# Patient Record
Sex: Male | Born: 1950 | Race: White | Hispanic: No | Marital: Single | Smoking: Current every day smoker
Health system: Southern US, Community
[De-identification: ages and names within clinical notes are randomized; demographics above are authoritative.]

## PROBLEM LIST (undated history)

## (undated) DIAGNOSIS — B029 Zoster without complications: Secondary | ICD-10-CM

## (undated) DIAGNOSIS — H579 Unspecified disorder of eye and adnexa: Secondary | ICD-10-CM

## (undated) DIAGNOSIS — G510 Bell's palsy: Principal | ICD-10-CM

## (undated) HISTORY — PX: APPENDECTOMY: SHX54

## (undated) HISTORY — PX: NO PAST SURGERIES: SHX2092

---

## 1978-10-18 DIAGNOSIS — G510 Bell's palsy: Principal | ICD-10-CM

## 1978-10-18 HISTORY — DX: Bell's palsy: G51.0

## 2007-02-22 ENCOUNTER — Emergency Department (HOSPITAL_COMMUNITY): Admission: EM | Admit: 2007-02-22 | Discharge: 2007-02-22 | Payer: Self-pay | Admitting: Emergency Medicine

## 2011-10-25 ENCOUNTER — Encounter: Payer: Self-pay | Admitting: Emergency Medicine

## 2011-10-25 ENCOUNTER — Inpatient Hospital Stay (HOSPITAL_COMMUNITY)
Admission: EM | Admit: 2011-10-25 | Discharge: 2011-10-27 | DRG: 074 | Disposition: A | Discharge status: Home / Self Care | Payer: 59 | Attending: Internal Medicine | Admitting: Internal Medicine

## 2011-10-25 ENCOUNTER — Emergency Department (HOSPITAL_COMMUNITY): Payer: 59

## 2011-10-25 DIAGNOSIS — G510 Bell's palsy: Principal | ICD-10-CM | POA: Diagnosis present

## 2011-10-25 DIAGNOSIS — H538 Other visual disturbances: Secondary | ICD-10-CM | POA: Diagnosis present

## 2011-10-25 DIAGNOSIS — F40298 Other specified phobia: Secondary | ICD-10-CM | POA: Diagnosis present

## 2011-10-25 DIAGNOSIS — R29810 Facial weakness: Secondary | ICD-10-CM | POA: Diagnosis present

## 2011-10-25 DIAGNOSIS — F17200 Nicotine dependence, unspecified, uncomplicated: Secondary | ICD-10-CM | POA: Diagnosis present

## 2011-10-25 DIAGNOSIS — H539 Unspecified visual disturbance: Secondary | ICD-10-CM

## 2011-10-25 DIAGNOSIS — R51 Headache: Secondary | ICD-10-CM | POA: Diagnosis present

## 2011-10-25 DIAGNOSIS — I10 Essential (primary) hypertension: Secondary | ICD-10-CM | POA: Diagnosis present

## 2011-10-25 HISTORY — DX: Unspecified disorder of eye and adnexa: H57.9

## 2011-10-25 HISTORY — DX: Zoster without complications: B02.9

## 2011-10-25 HISTORY — DX: Bell's palsy: G51.0

## 2011-10-25 LAB — CBC
HCT: 41.6 % (ref 39.0–52.0)
Hemoglobin: 14.8 g/dL (ref 13.0–17.0)
MCH: 33.5 pg (ref 26.0–34.0)
MCV: 94.1 fL (ref 78.0–100.0)
RBC: 4.42 MIL/uL (ref 4.22–5.81)
WBC: 10.5 10*3/uL (ref 4.0–10.5)

## 2011-10-25 LAB — RAPID URINE DRUG SCREEN, HOSP PERFORMED
Barbiturates: NOT DETECTED
Cocaine: NOT DETECTED
Tetrahydrocannabinol: NOT DETECTED

## 2011-10-25 LAB — LIPID PANEL
Cholesterol: 197 mg/dL (ref 0–200)
HDL: 103 mg/dL (ref >39)
Total CHOL/HDL Ratio: 1.9 RATIO

## 2011-10-25 LAB — BASIC METABOLIC PANEL
CO2: 29 mEq/L (ref 19–32)
Calcium: 10.1 mg/dL (ref 8.4–10.5)
Chloride: 100 mEq/L (ref 96–112)
Creatinine, Ser: 0.71 mg/dL (ref 0.50–1.35)
Glucose, Bld: 93 mg/dL (ref 70–99)

## 2011-10-25 MED ORDER — SODIUM CHLORIDE 0.9 % IJ SOLN
3.0000 mL | Freq: Two times a day (BID) | INTRAMUSCULAR | Status: DC
  Administered 2011-10-25 – 2011-10-27 (×4): 3 mL via INTRAVENOUS

## 2011-10-25 MED ORDER — ACYCLOVIR 200 MG PO CAPS: 400.0000 mg | ORAL_CAPSULE | Freq: Once | ORAL | Status: DC

## 2011-10-25 MED ORDER — ENOXAPARIN SODIUM 40 MG/0.4ML ~~LOC~~ SOLN
40.0000 mg | SUBCUTANEOUS | Status: DC
  Administered 2011-10-25 – 2011-10-26 (×2): 40 mg via SUBCUTANEOUS
  Filled 2011-10-25 (×3): qty 0.4

## 2011-10-25 MED ORDER — ACETAMINOPHEN 325 MG PO TABS: 650.0000 mg | ORAL_TABLET | Freq: Four times a day (QID) | ORAL | Status: DC | PRN

## 2011-10-25 MED ORDER — ONDANSETRON HCL 4 MG/2ML IJ SOLN: 4.0000 mg | Freq: Four times a day (QID) | INTRAMUSCULAR | Status: DC | PRN

## 2011-10-25 MED ORDER — SODIUM CHLORIDE 0.9 % IV SOLN: 250.0000 mL | INTRAVENOUS | Status: DC | PRN

## 2011-10-25 MED ORDER — LABETALOL HCL 5 MG/ML IV SOLN: 5.0000 mg | INTRAVENOUS | Status: DC | PRN

## 2011-10-25 MED ORDER — LABETALOL HCL 5 MG/ML IV SOLN: 10.0000 mg | INTRAVENOUS | Status: DC | PRN

## 2011-10-25 MED ORDER — PREDNISONE 20 MG PO TABS
60.0000 mg | ORAL_TABLET | Freq: Once | ORAL | Status: AC
  Administered 2011-10-25: 60 mg via ORAL
  Filled 2011-10-25: qty 3

## 2011-10-25 MED ORDER — ACETAMINOPHEN 650 MG RE SUPP: 650.0000 mg | Freq: Four times a day (QID) | RECTAL | Status: DC | PRN

## 2011-10-25 MED ORDER — LABETALOL HCL 5 MG/ML IV SOLN
10.0000 mg | Freq: Once | INTRAVENOUS | Status: AC
  Administered 2011-10-25: 10 mg via INTRAVENOUS
  Filled 2011-10-25: qty 4

## 2011-10-25 MED ORDER — SODIUM CHLORIDE 0.9 % IJ SOLN: 3.0000 mL | INTRAMUSCULAR | Status: DC | PRN

## 2011-10-25 MED ORDER — VALACYCLOVIR HCL 500 MG PO TABS
1000.0000 mg | ORAL_TABLET | Freq: Once | ORAL | Status: AC
  Administered 2011-10-25: 1000 mg via ORAL
  Filled 2011-10-25: qty 2

## 2011-10-25 MED ORDER — ONDANSETRON HCL 4 MG PO TABS: 4.0000 mg | ORAL_TABLET | Freq: Four times a day (QID) | ORAL | Status: DC | PRN

## 2011-10-25 NOTE — ED Notes (Signed)
Noticed blurred vision both eyes since 0830  And left side of face numbness and throbbing in back of head

## 2011-10-25 NOTE — H&P (Signed)
Brent Sharp is an 61 y.o. male. 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  These symptoms began today about 8 am and shortly after patient developed a 2/10 dull aching headache on the L posterior occiput.  Blurry vision is intermittant and associated with new onset diplopia as well. Patient also endorses numbeness of top L lip.  Otherwise patient denies weakness, sensory deficit, vertigo, lightheadedness, imbalance, falls, syncope, change in speech or hearing, nausea, vomitting, diarrhea, shortness of breath, palpitations, chest pain.   Review of Systems  HENT: Negative for hearing loss, ear pain and tinnitus.   Eyes: Positive for blurred vision (new onset L eye) and double vision. Negative for photophobia, pain and discharge.  Cardiovascular: Negative for chest pain and palpitations.  Gastrointestinal: Negative for nausea, vomiting, abdominal pain, diarrhea and constipation.  Musculoskeletal: Negative for falls.  Skin: Negative for itching and rash.  Neurological: Positive for focal weakness (left face, left eye, cannot blink left eye), weakness (left face) and headaches (left, posterior, 2/10). Negative for dizziness, sensory change and speech change.  Psychiatric/Behavioral: The patient is nervous/anxious (regarding MRI, extreme claustrophobia).    Past Medical History  Diagnosis Date  . Bell's palsy 1980    L sided, resolved completely in about 1 week  . Shingles     at age 43 and 57, both outbreaks on back, treatment with "shots" per patient  . Eye disease     "macular scarring" per patient, peripheral vision only in R eye   Past Surgical History  Procedure Date  . Appendectomy     61 year old   History   Social History  . Marital Status: Single    Spouse Name: N/A    Number  of Children: 0  . Years of Education: N/A   Occupational History  . accountant    Social History Main Topics  . Smoking status: Current Everyday Smoker -- 0.5 packs/day for 47 years    Types: Cigarettes  . Smokeless tobacco: Not on file  . Alcohol Use: Yes     1 shot and 2 beers every other day  . Drug Use: No  . Sexually Active: Not on file   Other Topics Concern  . Not on file   Social History Narrative   Lives alone, single in [place].Does not drive 2/2 several DUIs in past.No pets.Works as an Airline pilot.    Blood pressure 224/122, pulse 112, temperature 97.8 F (36.6 C), temperature source Oral, resp. rate 16, SpO2 96.00%. Physical Exam  Constitutional: He is oriented to person, place, and time. He appears well-developed and well-nourished. No distress.  HENT:  Head: Normocephalic and atraumatic.  Right Ear: External ear normal.  Left Ear: External ear normal.  Eyes: Conjunctivae and EOM are normal. Pupils are equal, round, and reactive to light. Right eye exhibits no discharge. Left eye exhibits no discharge. No scleral icterus. Right eye exhibits normal extraocular motion and no nystagmus. Left eye exhibits normal extraocular motion and no nystagmus.  Neck: Normal range of motion. Neck supple. No JVD present. No tracheal deviation present.  Cardiovascular: S1 normal, S2 normal and intact distal pulses.  Exam reveals no gallop and no friction rub.   No murmur heard.      Several irregularly spaced dropped beats on ausculation, irregular  rate  Respiratory: Effort normal and breath sounds normal. No stridor. Not tachypneic. No respiratory distress. He has no wheezes. He has no rhonchi. He has no rales. He exhibits no tenderness.  Musculoskeletal: He exhibits no edema and no tenderness.  Neurological: He is alert and oriented to person, place, and time. He has normal strength and normal reflexes. He displays no tremor and normal reflexes. A cranial nerve deficit (CN  XII-deviation of tongue to the R on protrusion,  CN VII-weakness of L sided upper and lower face) is present. No sensory deficit. He exhibits normal muscle tone. He displays no seizure activity. Coordination (cerebellar testing wnl) normal. He displays no Babinski's sign on the right side. He displays no Babinski's sign on the left side.  Reflex Scores:      Bicep reflexes are 2+ on the right side and 2+ on the left side.      Patellar reflexes are 2+ on the right side and 2+ on the left side. Skin: Skin is warm and dry. No rash noted. He is not diaphoretic. No erythema. No pallor.  Psychiatric: He has a normal mood and affect. His behavior is normal. Judgment and thought content normal.    Labs: Admission on 10/25/2011  Component Date Value Range Status  . WBC (K/uL) 10/25/2011 10.5  4.0-10.5 Final  . RBC (MIL/uL) 10/25/2011 4.42  4.22-5.81 Final  . Hemoglobin (g/dL) 30/86/5784 69.6  29.5-28.4 Final  . HCT (%) 10/25/2011 41.6  39.0-52.0 Final  . MCV (fL) 10/25/2011 94.1  78.0-100.0 Final  . MCH (pg) 10/25/2011 33.5  26.0-34.0 Final  . MCHC (g/dL) 13/24/4010 27.2  53.6-64.4 Final  . RDW (%) 10/25/2011 14.6  11.5-15.5 Final  . Platelets (K/uL) 10/25/2011 262  150-400 Final  . Sodium (mEq/L) 10/25/2011 141  135-145 Final  . Potassium (mEq/L) 10/25/2011 4.5  3.5-5.1 Final  . Chloride (mEq/L) 10/25/2011 100  96-112 Final  . CO2 (mEq/L) 10/25/2011 29  19-32 Final  . Glucose, Bld (mg/dL) 03/47/4259 93  56-38 Final  . BUN (mg/dL) 75/64/3329 8  5-18 Final  . Creatinine, Ser (mg/dL) 84/16/6063 0.16  0.10-9.32 Final  . Calcium (mg/dL) 35/57/3220 25.4  2.7-06.2 Final  . GFR calc non Af Amer (mL/min) 10/25/2011 >90  >90 Final  . GFR calc Af Amer (mL/min) 10/25/2011 >90  >90 Final   UDS:  Lipid:  Hepatitis:  HIV:  Imaging: CT Head w/o Contrast Findings: There is no acute intracranial hemorrhage, infarction, or  mass. Brain parenchyma is normal. Osseous structures are normal.  Orbits  appear normal.  IMPRESSION:  Normal exam.  Assessment/Plan 1) Left facial weakness with blurry vision: Differential diagnoses includes Bell's palsy versus CVA versus hypertensive urgency. CT scan negative for CVA. MRI not done as patient claustrophobic. Labs unremarkable.  Plan: Admit patient to telemetry bed. - Control blood pressure with goal of more than 170/100. - MRI brain if any change in neuro status. - .Prednisone and Valtrex for Bell's palsy - Check hepatitis C panel  2) Hypertensive urgency: No prior history of hypertension. Electrolytes within normal limits.  Plan: Start labetalol PRN with BP goal of no less than 170/100. - Check Lipid panel, UDS.  3) DVT prophylaxis: Lovenox.  Dwaine Gale 10/25/2011, 6:56 PM

## 2011-10-25 NOTE — ED Notes (Signed)
Admitting at bedside 

## 2011-10-25 NOTE — ED Notes (Signed)
[facility] paged

## 2011-10-25 NOTE — ED Notes (Signed)
CT

## 2011-10-25 NOTE — ED Notes (Signed)
Pt unable to tolerate MRI, PA notified

## 2011-10-25 NOTE — ED Notes (Signed)
Pt reports around 830 this am he had sudden onset of left facial tingling mainly to lips, blurred vision to left eye (pt with macular scaring to right eye with only peripheral vision), and left sided facial droop. On exam pts eyebrows are not symmetrical, pt with left sided facial droop, pt is alert and oriented x 3. Pt reports he has had intermittent headache to the back of his head rates it 1/10. Pt with no numbness to the left side of face at this time. States lips are tingly.

## 2011-10-25 NOTE — ED Provider Notes (Signed)
History     CSN: [id number]  Arrival date & time 10/25/11  1304   First MD Initiated Contact with Patient 10/25/11 1530      Chief Complaint  Patient presents with  . Blurred Vision    (Consider location/radiation/quality/duration/timing/severity/associated sxs/prior treatment) HPI History provided by pt.   Pt reports that left eye started to twitch on his way to work at 8:30am today.  When he turned on his computer at work, he noticed diplopia (words on top of each other).  When he closed his right eye, the diplopia resolved but the words on the screen were still blurred.  Vision changes associated w/ left facial droop, inability to close left eyelid and numbness of left upper lip.  Developed a mild left posterior headache this afternoon.  Denies confusion, dysarthria, dysphagia, extremity weakness/paresthesias, ataxia.  Symptoms have not improved at all since onset.  Pt has h/o right macular scarring and therefore has no central vision on this side.  He is otherwise healthy.  FH stroke at later age.    Past Medical History  Diagnosis Date  . Bell's palsy     No past surgical history on file.  No family history on file.  History  Substance Use Topics  . Smoking status: Not on file  . Smokeless tobacco: Not on file  . Alcohol Use:       Review of Systems  All other systems reviewed and are negative.    Allergies  Review of patient's allergies indicates no known allergies.  Home Medications  No current outpatient prescriptions on file.  BP 226/141  Pulse 112  Temp(Src) 97.8 F (36.6 C) (Oral)  Resp 16  SpO2 96%  Physical Exam  Nursing note and vitals reviewed. Constitutional: He is oriented to person, place, and time. He appears well-developed and well-nourished. No distress.  HENT:  Head: Normocephalic and atraumatic.  Eyes:       Normal appearance  Neck: Normal range of motion.  Cardiovascular: Normal rate, regular rhythm and intact distal pulses.    Hypertensive at 209/130  Pulmonary/Chest: Effort normal and breath sounds normal.  Musculoskeletal: Normal range of motion.  Neurological: He is alert and oriented to person, place, and time. He has normal reflexes. He displays no tremor. He displays a negative Romberg sign. Coordination and gait normal.       Left facial drooping w/ inability to raise left upper lip  or close left eyelid.  Can not lift left eyebrow as high as the right. Decreased sensation left upper lip but otherwise no sensory deficits.  5/5 and equal upper and lower extremity strength.  No past pointing.  No pronator drift.    Skin: Skin is warm and dry. No rash noted.  Psychiatric: He has a normal mood and affect. His behavior is normal.    ED Course  Procedures (including critical care time)   Labs Reviewed  CBC  BASIC METABOLIC PANEL  URINE RAPID DRUG SCREEN (HOSP PERFORMED)  LIPID PANEL  RPR  COMPREHENSIVE METABOLIC PANEL  CBC  HEPATITIS PANEL, ACUTE  HIV ANTIBODY (ROUTINE TESTING)   Ct Head Wo Contrast  10/25/2011  *RADIOLOGY REPORT*  Clinical Data: Blurred vision. Left facial drooping.  Left facial paresthesias.  CT HEAD WITHOUT CONTRAST  Technique:  Contiguous axial images were obtained from the base of the skull through the vertex without contrast.  Comparison: 02/22/2007  Findings: There is no acute intracranial hemorrhage, infarction, or mass.  Brain parenchyma is normal.  Osseous structures  are normal. Orbits appear normal.  IMPRESSION: Normal exam.  Original Report Authenticated By: Gwynn Burly, M.D.     1. Vision changes   2. Hypertension       MDM  Previously healthy 61yo M presents w/ vision changes, left facial droop and left upper lip numbness since 8am.  Obvious left facial droop on exam but more consistent w/ bells palsy (able to lift left eyebrow but still asymmetrical).  No other neuro deficits.  BP 226/124 in triage but has improved to 180/103 (w/out improvement in vision).  Visual  acuity 20/40 L, 20/70 R and 20/70 both.  CT head neg and labs unremarkable.  MRI brain pending.  Pt has received prednisone and valacyclovir for bells palsy.  Teaching service consulted for admission for stroke vs. Hypertensive emergency.  Because ischemic stroke has not been ruled out and is improving w/out intervention, did not treat w/ anti-hypertensives.  5:47 PM   Pt went to MRI and then refused exam d/t anxiety.  Will have admitting team order if they feel it is still necessary.  5:48 PM         Otilio Miu, PA 10/26/11 [phone]

## 2011-10-26 ENCOUNTER — Other Ambulatory Visit: Payer: Self-pay

## 2011-10-26 DIAGNOSIS — R0989 Other specified symptoms and signs involving the circulatory and respiratory systems: Secondary | ICD-10-CM

## 2011-10-26 DIAGNOSIS — I10 Essential (primary) hypertension: Secondary | ICD-10-CM | POA: Diagnosis present

## 2011-10-26 LAB — RPR: RPR Ser Ql: NONREACTIVE

## 2011-10-26 LAB — CBC
HCT: 38.9 % — ABNORMAL LOW (ref 39.0–52.0)
MCH: 32.3 pg (ref 26.0–34.0)
MCV: 93.1 fL (ref 78.0–100.0)
RDW: 14.7 % (ref 11.5–15.5)
WBC: 7 10*3/uL (ref 4.0–10.5)

## 2011-10-26 LAB — COMPREHENSIVE METABOLIC PANEL
ALT: 11 U/L (ref 0–53)
AST: 13 U/L (ref 0–37)
Albumin: 4.1 g/dL (ref 3.5–5.2)
Alkaline Phosphatase: 64 U/L (ref 39–117)
CO2: 26 mEq/L (ref 19–32)
Chloride: 98 mEq/L (ref 96–112)
Creatinine, Ser: 0.68 mg/dL (ref 0.50–1.35)
GFR calc non Af Amer: >90 mL/min (ref >90)
Potassium: 3.9 mEq/L (ref 3.5–5.1)
Sodium: 135 mEq/L (ref 135–145)
Total Bilirubin: 0.6 mg/dL (ref 0.3–1.2)

## 2011-10-26 LAB — HEPATITIS PANEL, ACUTE
Hep A IgM: NEGATIVE
Hep B C IgM: NEGATIVE

## 2011-10-26 MED ORDER — PREDNISONE 10 MG PO TABS
60.0000 mg | ORAL_TABLET | Freq: Every day | ORAL | Status: DC
  Administered 2011-10-26 – 2011-10-27 (×2): 60 mg via ORAL
  Filled 2011-10-26 (×3): qty 1

## 2011-10-26 MED ORDER — ASPIRIN EC 81 MG PO TBEC
81.0000 mg | DELAYED_RELEASE_TABLET | Freq: Every day | ORAL | Status: DC
  Administered 2011-10-26 – 2011-10-27 (×2): 81 mg via ORAL
  Filled 2011-10-26 (×2): qty 1

## 2011-10-26 MED ORDER — VALACYCLOVIR HCL 500 MG PO TABS
1000.0000 mg | ORAL_TABLET | Freq: Three times a day (TID) | ORAL | Status: DC
  Administered 2011-10-26 – 2011-10-27 (×2): 1000 mg via ORAL
  Filled 2011-10-26 (×4): qty 2

## 2011-10-26 NOTE — H&P (Signed)
59 man admitted for acute neurologic sx: blurring vision and/or diplopia, numbness of left upper lip, and left facial droop.  He became very frightened because his mother has had strokes.  He has a hx of shingles at age 61 and age 89, and has had Bell's Palsy 35 years ago.  Now he has a left facial droop with partial involvement of forehead and incomplete closure of left eye.  Pupils react and EOMI full; He reports fatigue diplopia while reading but none at the moment.  Extremity strength full. On admission, BP was very high: 224/122 but is normal today after only 1 dose of labetalol 15 hours ago.  Ectopy sensed on exam yesterday but not confirmed by monitoring overnight.  UDS negative.  HIV negative CBC and Bmet normal. Head CT w/o contrast normal.  Refuses MR due to claustrophobia. This is most likely Bell's Palsy but stroke (especially posterior circulation) is possible.  Will try to arrange open MR and will ask Neuro to consult.  Treat for Bell's.  He should have follow-up to track BP but it seems now that he does not have HTN.  Counseled him to attempt formal smoking cessation.  Lipids are already optimal.

## 2011-10-26 NOTE — Consult Note (Signed)
[facility] STROKE CONSULT NOTE       Chief Complaint: Left facial droop   HPI:    Brent Sharp is an 61 y.o. male Who noted on Monday that his vision in th eleft eye seemed blurry.  HE states the blurred vision would come and go.  At the same time he also noted his left eye would tear up a lot.  When he would clear the tears from his eye he noted the vision improved temporarily.  Due to left facial droop, inability to close left eye and vision abnormalities he presented to the ED.  His CT head showed no acute stroke however his BP was 226/124.Marland Kitchen  He has had 7th cranial nerve pasley when he was 61 YO on the left side. At that time he states his facial droop was much more pronounced and he had dysarthria.  Symptoms at that time resolved in about 3 weeks.      LSN: Monday 10-25-11 tPA Given: No: out of window    Past Medical History  Diagnosis Date  . Bell's palsy 1980    L sided, resolved completely in about 1 week  . Shingles     at age 41 and 65, both outbreaks on back, treatment with "shots" per patient  . Eye disease     "macular scarring" per patient, peripheral vision only in R eye    Past Surgical History  Procedure Date  . Appendectomy     61 year old  . No past surgeries     Family History  Problem Relation Age of Onset  . Stroke Mother 57  . Aortic aneurysm Father 52    aortic valve aneurysm s/p transplant  . Diabetes Maternal Grandmother    Social History:  reports that he has been smoking Cigarettes.  He has a 23.5 pack-year smoking history. He does not have any smokeless tobacco history on file. He reports that he drinks alcohol. He reports that he does not use illicit drugs.  Allergies: No Known Allergies  Medications:    Prior to Admission:  No prescriptions prior to admission   Scheduled:   . enoxaparin  40 mg Subcutaneous Q24H  . labetalol  10 mg Intravenous Once  . predniSONE  60 mg Oral Once  . sodium chloride  3 mL  Intravenous Q12H  . valACYclovir  1,000 mg Oral Once  . DISCONTD: acyclovir  400 mg Oral Once    ROS: History obtained from the patient  General ROS: negative for - chills, fatigue, fever, night sweats, weight gain or weight loss Psychological ROS: negative for - behavioral disorder, hallucinations, memory difficulties, mood swings or suicidal ideation Ophthalmic ROS: negative for - blurry vision, double vision, eye pain or loss of vision ENT ROS: negative for - epistaxis, nasal discharge, oral lesions, sore throat, tinnitus or vertigo Allergy and Immunology ROS: negative for - hives or itchy/watery eyes Hematological and Lymphatic ROS: negative for - bleeding problems, bruising or swollen lymph nodes Endocrine ROS: negative for - galactorrhea, hair pattern changes, polydipsia/polyuria or temperature intolerance Respiratory ROS: negative for - cough, hemoptysis, shortness of breath or wheezing Cardiovascular ROS: negative for - chest pain, dyspnea on exertion, edema or irregular heartbeat Gastrointestinal ROS: negative for - abdominal pain, diarrhea, hematemesis, nausea/vomiting or stool incontinence Genito-Urinary ROS: negative for - dysuria, hematuria, incontinence or urinary frequency/urgency Musculoskeletal ROS: negative for - joint swelling or muscular weakness Neurological ROS: as  noted in HPI Dermatological ROS: negative for rash and skin lesion changes   Physical Examination: Blood pressure 121/75, pulse 102, temperature 98.1 F (36.7 C), temperature source Oral, resp. rate 18, height 5\' 11"  (1.803 m), weight 62.9 kg (138 lb 10.7 oz), SpO2 92.00%.  Neurologic Examination:  Mental Status: Alert, oriented, thought content appropriate.  Speech fluent without evidence of aphasia. Able to follow 3 step commands without difficulty. Cranial Nerves: II-Visual fields grossly intact. He has baseline decreased central vision loss in right eye III/IV/VI-Extraocular movements intact.   Pupils reactive bilaterally. V/VII-Smile asymmetric with decreased ability to move left forehead but still can make furrows.  Inability to close right eyelid, facial droop in left NLF and corner of mouth. Facial sensation is intact bilaterally. VIII-grossly intact IX/X-normal gag XI-bilateral shoulder shrug XII-midline tongue extension Motor: 5/5 bilaterally with normal tone and bulk Sensory: Pinprick and light touch intact throughout, bilaterally Deep Tendon Reflexes: 2+ and symmetric throughout Plantars: Downgoing bilaterally Cerebellar: Normal finger-to-nose, normal rapid alternating movements and normal heel-to-shin test.      Lab Results  Component Value Date/Time   CHOL 197 10/25/2011  8:29 PM   Ct Head Wo Contrast  10/25/2011  *RADIOLOGY REPORT*  Clinical Data: Blurred vision. Left facial drooping.  Left facial paresthesias.  CT HEAD WITHOUT CONTRAST  Technique:  Contiguous axial images were obtained from the base of the skull through the vertex without contrast.  Comparison: 02/22/2007  Findings: There is no acute intracranial hemorrhage, infarction, or mass.  Brain parenchyma is normal.  Osseous structures are normal. Orbits appear normal.  IMPRESSION: Normal exam.  Original Report Authenticated By: Gwynn Burly, M.D.    Carotid doppler --negative for ICA stenosis EKG--NSR LDL--83  Assessment:    60 y.o. male who presented to the ED due to left facial droop and blurred vision in the setting of BP 226/124.  His hypertension has resolved however he continues to have left facial droop, inability to close left eye, and decreased ability to furrow his left forehead. Exam consistent with a Bells Palsy. Complaints of vision not consistent and patient not the best historian.  Can not rule out the possibility of a small ischemic event, especially in the clinical setting of markedly elevated BP.  Patient will not consent to even an open MRI after discharge.  Stroke Risk Factors -  hypertension  Plan:  1. Prophylactic therapy-Antiplatelet med: Aspirin - dose 81 mg daily 2. Follow up out patient neurology after D/C 3. Lyme titer 4. TSH      Patient seen and examined. I agree with the above.  Thana Farr, MD [facility] [phone]  10/26/2011  5:54 PM

## 2011-10-26 NOTE — Progress Notes (Signed)
Subjective: Patient feeling well this morning.  Says vision is about 50% better, could read the paper well for first 10 minutes or so before developing blurry vision.  Also reports on questioning that he did have some photophobia with the onset of symptoms yesterday but never pain with EOM.  Pain was not a significant a component as it is with headaches that he gets occasionally during stressful times at work.  Facial weakness unchanged and no new symptoms reported. Objective: Vital signs in last 24 hours: Filed Vitals:   10/25/11 1905 10/25/11 2027 10/26/11 0124 10/26/11 0633  BP: 181/107 177/97 149/82 143/84  Pulse:  95 87 84  Temp:  98.3 F (36.8 C) 98.3 F (36.8 C) 98.4 F (36.9 C)  TempSrc:  Oral Oral Oral  Resp: 16 20 22 20   Height:  5\' 11"  (1.803 m)    Weight:  62.9 kg (138 lb 10.7 oz)    SpO2:  96% 94% 96%   Weight change:   Intake/Output Summary (Last 24 hours) at 10/26/11 0936 Last data filed at 10/26/11 0839  Gross per 24 hour  Intake    240 ml  Output      0 ml  Net    240 ml   Physical Exam: General appearance: alert, cooperative, appears stated age and no distress Lungs: clear to auscultation bilaterally Heart: regular rate and rhythm, S1, S2 normal, no murmur, click, rub or gallop and normal apical impulse Abdomen: soft, non-tender; bowel sounds normal; no masses,  no organomegaly Neurologic: Mental status: Alert, oriented, thought content appropriate Cranial nerves: VII: upper facial muscle function normal bilaterally, VII: lower facial muscle function abnormal on the left. Patients mouth is dropping and he is unable to completely close eye. Very little involuntary forehead motion on the left. Lab Results: Admission on 10/25/2011  Component Date Value Range Status  . WBC (K/uL) 10/25/2011 10.5  4.0-10.5 Final  . RBC (MIL/uL) 10/25/2011 4.42  4.22-5.81 Final  . Hemoglobin (g/dL) 16/07/9603 54.0  98.1-19.1 Final  . HCT (%) 10/25/2011 41.6  39.0-52.0 Final  . MCV  (fL) 10/25/2011 94.1  78.0-100.0 Final  . MCH (pg) 10/25/2011 33.5  26.0-34.0 Final  . MCHC (g/dL) 47/82/9562 13.0  86.5-78.4 Final  . RDW (%) 10/25/2011 14.6  11.5-15.5 Final  . Platelets (K/uL) 10/25/2011 262  150-400 Final  . Sodium (mEq/L) 10/25/2011 141  135-145 Final  . Potassium (mEq/L) 10/25/2011 4.5  3.5-5.1 Final  . Chloride (mEq/L) 10/25/2011 100  96-112 Final  . CO2 (mEq/L) 10/25/2011 29  19-32 Final  . Glucose, Bld (mg/dL) 69/62/9528 93  41-32 Final  . BUN (mg/dL) 44/10/270 8  5-36 Final  . Creatinine, Ser (mg/dL) 64/40/3474 2.59  5.63-8.75 Final  . Calcium (mg/dL) 64/33/2951 88.4  1.6-60.6 Final  . GFR calc non Af Amer (mL/min) 10/25/2011 >90  >90 Final  . GFR calc Af Amer (mL/min) 10/25/2011 >90  >90 Final  . Sodium (mEq/L) 10/26/2011 135  135-145 Final  . Potassium (mEq/L) 10/26/2011 3.9  3.5-5.1 Final  . Chloride (mEq/L) 10/26/2011 98  96-112 Final  . CO2 (mEq/L) 10/26/2011 26  19-32 Final  . Glucose, Bld (mg/dL) 30/16/0109 323* 55-73 Final  . BUN (mg/dL) 22/11/5425 9  0-62 Final  . Creatinine, Ser (mg/dL) 37/62/8315 1.76  1.60-7.37 Final  . Calcium (mg/dL) 10/62/6948 9.7  5.4-62.7 Final  . Total Protein (g/dL) 03/50/0938 7.0  1.8-2.9 Final  . Albumin (g/dL) 93/71/6967 4.1  8.9-3.8 Final  . AST (U/L) 10/26/2011 13  0-37 Final  .  ALT (U/L) 10/26/2011 11  0-53 Final  . Alkaline Phosphatase (U/L) 10/26/2011 64  39-117 Final  . Total Bilirubin (mg/dL) 40/98/1191 0.6  4.7-8.2 Final  . GFR calc non Af Amer (mL/min) 10/26/2011 >90  >90 Final  . GFR calc Af Amer (mL/min) 10/26/2011 >90  >90 Final  . Opiates  10/25/2011 NONE DETECTED  NONE DETECTED Final  . Cocaine  10/25/2011 NONE DETECTED  NONE DETECTED Final  . Benzodiazepines  10/25/2011 NONE DETECTED  NONE DETECTED Final  . Amphetamines  10/25/2011 NONE DETECTED  NONE DETECTED Final  . Tetrahydrocannabinol  10/25/2011 NONE DETECTED  NONE DETECTED Final  . Barbiturates  10/25/2011 NONE DETECTED  NONE DETECTED Final    . Cholesterol (mg/dL) 95/62/1308 657  8-469 Final  . Triglycerides (mg/dL) 62/95/2841 55  <324 Final  . HDL (mg/dL) 40/07/2724 366  >44 Final  . Total CHOL/HDL Ratio (RATIO) 10/25/2011 1.9   Final  . VLDL (mg/dL) 03/47/4259 11  5-63 Final  . LDL Cholesterol (mg/dL) 87/56/4332 83  9-51 Final  . WBC (K/uL) 10/26/2011 7.0  4.0-10.5 Final  . RBC (MIL/uL) 10/26/2011 4.18* 4.22-5.81 Final  . Hemoglobin (g/dL) 88/41/6606 30.1  60.1-09.3 Final  . HCT (%) 10/26/2011 38.9* 39.0-52.0 Final  . MCV (fL) 10/26/2011 93.1  78.0-100.0 Final  . MCH (pg) 10/26/2011 32.3  26.0-34.0 Final  . MCHC (g/dL) 23/55/7322 02.5  42.7-06.2 Final  . RDW (%) 10/26/2011 14.7  11.5-15.5 Final  . Platelets (K/uL) 10/26/2011 267  150-400 Final  . HIV  10/25/2011 NON REACTIVE  NON REACTIVE Final    Micro Results: No results found for this or any previous visit (from the past 240 hour(s)). Studies/Results: Ct Head Wo Contrast  10/25/2011  *RADIOLOGY REPORT*  Clinical Data: Blurred vision. Left facial drooping.  Left facial paresthesias.  CT HEAD WITHOUT CONTRAST  Technique:  Contiguous axial images were obtained from the base of the skull through the vertex without contrast.  Comparison: 02/22/2007  Findings: There is no acute intracranial hemorrhage, infarction, or mass.  Brain parenchyma is normal.  Osseous structures are normal. Orbits appear normal.  IMPRESSION: Normal exam.  Original Report Authenticated By: Gwynn Burly, M.D.   Medications: I have reviewed the patient's current medications. Scheduled Meds:   . enoxaparin  40 mg Subcutaneous Q24H  . labetalol  10 mg Intravenous Once  . predniSONE  60 mg Oral Once  . sodium chloride  3 mL Intravenous Q12H  . valACYclovir  1,000 mg Oral Once   Continuous Infusions:  PRN Meds:.sodium chloride, acetaminophen, acetaminophen, labetalol, ondansetron (ZOFRAN) IV, ondansetron, sodium chloride, DISCONTD: labetalol Assessment/Plan: Active Problems:  Blurry vision, left  eye  Facial paralysis/Bells palsy  Hypertension  1) Left facial weakness with blurry vision: Differential diagnoses includes Bell's palsy versus CVA versus hypertensive urgency/encephalopathy.  CT scan negative for hemorrhagic CVA. MRI not done as patient claustrophobic and unwilling to accept. Labs unremarkable. Patient received 1 dose Labetalol, Valtrex, and Prednisone. Will continue treatment. Vision improved 50% subjectively, will f/u with outside opthamologist for baseline.  Plan: Admitted patient to telemetry bed. - Control blood pressure with Labetalol if BP >200/110. - MRI brain if any change in neuro status. - F/U hepatitis C panel -Telemetry x24 hours to assess abnormal cardiac rhythm on exam. EKG on admission normal. -Recommend starting aspirin as outpatient. -patient not willing to quit smoking but was given extensive counseling -consulted neurology, appreciate recommendations  2) Hypertensive urgency vs encephalopathy: No prior history of hypertension. BP dropped to 143/84 with no  further intervention overnight. Electrolytes within normal limits.  Plan: Labetalol PRN with BP goal of no less than 170/100. - Lipid panel: wnl -UDS neg  3) Abnormal rhythm (irrgularly dropped beats) on cardiac ausculation. -not noted on exam today -will monitor on telemetry for 24 hours -EKG on admission normal sinus rhythm, questionable irregular rate. -history of heart murmur (believed benign) as a child  3) DVT prophylaxis: Lovenox.  4)Dispo: Will return home.  Patient is amenable to following up with PCP as outpatient.  Will refer to local PCP for BP and symptom follow up.  LOS: 1 day   Brent Sharp 10/26/2011, 9:36 AM

## 2011-10-26 NOTE — Progress Notes (Signed)
VASCULAR LAB PRELIMINARY  PRELIMINARY  PRELIMINARY  PRELIMINARY  Carotid duplex  completed.    Preliminary report:  Bilateral:  No evidence of hemodynamically significant internal carotid artery stenosis.   Vertebral artery flow is antegrade.      Terance Hart, RVT 10/26/2011, 1:08 PM

## 2011-10-27 LAB — TSH: TSH: 3.133 u[IU]/mL (ref 0.350–4.500)

## 2011-10-27 MED ORDER — PREDNISONE 20 MG PO TABS: 60.0000 mg | ORAL_TABLET | Freq: Every day | ORAL | Status: DC

## 2011-10-27 MED ORDER — ASPIRIN 81 MG PO TBEC: 81.0000 mg | DELAYED_RELEASE_TABLET | Freq: Every day | ORAL | Status: AC

## 2011-10-27 MED ORDER — VALACYCLOVIR HCL 1 G PO TABS: 1000.0000 mg | ORAL_TABLET | Freq: Three times a day (TID) | ORAL | Status: DC

## 2011-10-27 NOTE — Discharge Summary (Signed)
Physician Discharge Summary   Internal Medicine Teaching [facility] Discharge Note  Name: Brent Sharp MRN: [id number] DOB: [birth year] 61 y.o.  Date of Admission: 10/25/2011  3:28 PM Date of Discharge: 10/27/2011 Attending Physician: No att. providers found  Discharge Diagnosis: Bell's Palsy- patient had head CT and carotid doppler with no abnormal findings Blurry vision, left eye Hypertensive Urgency Macular Scarring with central blindness, R eye History of Shingles outbreak x 2  Discharge Medications: Discharge Medication List as of 10/27/2011  1:44 PM    START taking these medications   Details  aspirin EC 81 MG EC tablet Take 1 tablet (81 mg total) by mouth daily., Starting 10/27/2011, Until Thu 10/26/12, Print    predniSONE (DELTASONE) 20 MG tablet Take 3 tablets (60 mg total) by mouth daily., Starting 10/27/2011, Until Sat 11/06/11, Print    valACYclovir (VALTREX) 1000 MG tablet Take 1 tablet (1,000 mg total) by mouth 3 (three) times daily., Starting 10/27/2011, Until Thu 10/26/12, Print        Disposition and follow-up:   Brent Sharp was discharged from [facility] in improved condition.    Follow-up Appointments: Follow-up Information    Follow up with Blanca Friend, MD on 11/03/2011.   Contact information:   [address] [place] [address] [phone]       Make an appointment with KOOP,TIMOTHY.      Follow up with REYNOLDS,LESLIE D, MD. Schedule an appointment as soon as possible for a visit in 1 week. ([facility] should call you to schedule appt)    Contact information:   [address] [facility] [place] [address] [phone]         Discharge Orders    Future Appointments: Provider: Department: Dept Phone: Center:   11/03/2011 11:15 AM Blanca Friend, MD [facility] [phone] [facility]     Future Orders Please Complete By Expires   Diet - low sodium heart healthy      Discharge instructions      Comments:   Please start taking baby aspirin each day to help prevent a stroke or heart attack.   Activity as tolerated - No restrictions      Call MD for:  temperature >100.4      Call MD for:  difficulty breathing, headache or visual disturbances      Call MD for:      Comments:   Tingling or pain on your face, changes in hearing or vision or development of fluid filled blisters on your face.      Consultations: Treatment Team:  [facility]  Procedures Performed:  Ct Head Wo Contrast  10/25/2011  *RADIOLOGY REPORT*  Clinical Data: Blurred vision. Left facial drooping.  Left facial paresthesias.  CT HEAD WITHOUT CONTRAST  Technique:  Contiguous axial images were obtained from the base of the skull through the vertex without contrast.  Comparison: 02/22/2007  Findings: There is no acute intracranial hemorrhage, infarction, or mass.  Brain parenchyma is normal.  Osseous structures are normal. Orbits appear normal.  IMPRESSION: Normal exam.  Original Report Authenticated By: Gwynn Burly, M.D.   Admission HPI:  Patient is a 61 year old with history of macular scarring of R eye with resultant peripheral vision and remote history of shingles, Bell's Palsy (unilateral on L) who presents today with new onset blurry vision of L eye and L facial weakness. These symptoms began today about 8 am and shortly after patient developed  a 2/10 dull aching headache on the L posterior occiput. Blurry vision is intermittant and associated with new onset diplopia as well. Patient also endorses numbeness of top L lip. Otherwise patient denies weakness, sensory deficit, vertigo, lightheadedness, imbalance, falls, syncope, change in speech or hearing, nausea, vomitting, diarrhea, shortness of breath, palpitations, chest pain.   Hospital Course by problem list: 1. Facial Palsy: Patient presented to the hospital with new onset L facial droop and blurrry vision in the L eye beginning  the morning of his presentation.  There was no associated sensory impairment, skin lesions, dysarthria, ataxia, systemic illness or other neurologic deficits, however, blood pressure on admission was in the 220's/120's.  Non-contrast CT was done emergently and was negative for hemorrhagic stroke.  MRI was not performed secondary to patient's extreme claustrophobia.  Labs including CBC, BMP, lipid panel hepatitis panel, and HIV, and RPR were within normal limits.  Additional stroke workup included carotid dopplers and EKG which were both within normal limits.  Neurology was consulted and agreed with assessment of likely Bell's Palsy.  Ischemic stroke unlikely per neurology assessment.  Lyme titer and TSH were ordered per neurology recommendations as possible etiology of neurologic symptoms.  These labs are pending at time of discharge. Patient was started on Prednisone and Valtrex in the ED which he will continue upon discharge for a total course of 7 days.  Patient was also advised upon discharge to use artificial tears while awake and ophthalmic ointment at night for eye moisture until able to completely close his eye again to prevent corneal damage.  2. Hypertensive Urgency On admission blood pressure was severely elevated in 220s/120s and patient required 1 dose of Labetalol in the ED.  Patient has no known history of high blood pressure and blood pressure dropped to 120s-140s/70s-80s for the remainder of admission without further treatment.  Patient was discharged on Aspirin 81mg  daily for stroke and cardiac protection given 61 and risk factors of long term smoking. Patient may need a mild antihypertensive agent as an outpatient.   3. Cardiac Arrhythmia: On admission exam ectopy was suspected on cardiac exam, however, initial EKG showed no abnormalities.  Patient endorsed history of cardiac murmur in childhood.  Subsequent 24 hour telemetry was normal with no abnormal rates or rhythms noted.     Discharge Vitals:  BP 118/74  Pulse 84  Temp(Src) 97.9 F (36.6 C) (Oral)  Resp 18  Ht 5\' 11"  (1.803 m)  Wt 62.9 kg (138 lb 10.7 oz)  BMI 19.34 kg/m2  SpO2 93%  Discharge Labs: No results found for this or any previous visit (from the past 24 hour(s)).  More than 30 minutes was taken in the discharge of this patient.  Signed: Dwaine Gale 10/27/2011, 2:35 PM

## 2011-10-27 NOTE — Progress Notes (Signed)
Pt almost on his way out the door being discharged. He  smokes 5 cigarettes per day and inquires about using the e-cigarette. Discussed negative effects of using the e -cigarettes and suggested using a nicotine inhaler instead. Referred to MD for Rx. Discussed inhaler use instructions. Referred to 1-800 quit now for f/u and support. Discussed oral fixation substitutes, second hand smoke and in home smoking policy. Reviewed and gave pt Written education/contact information.

## 2011-10-27 NOTE — ED Provider Notes (Signed)
Medical screening examination/treatment/procedure(s) were conducted as a shared visit with non-physician practitioner(s) and myself.  I personally evaluated the patient during the encounter  Pt c/o blurry vision bil eyes and left facial droop onset this am. Also noted w severe htn. No headache. Left facial weakness on exam w equivocal forehead weakness. Ct/mri.   Suzi Roots, MD 10/27/11 [id number]

## 2011-10-27 NOTE — Progress Notes (Signed)
Subjective: Patient's facial weakness and blurry vision still present, however, patient says that the blurry vision is further improved with more episodes of clear vision.  Patient also endorses new "tingling" in his face on L cheek and infraorbital area, as well as some tinnitus in L ear (this is not a new symptom, patient endorses past episodes of tinnitus).  Feels these are signs of improvement and things "waking up".  Objective: Vital signs in last 24 hours: Filed Vitals:   10/26/11 1410 10/26/11 1800 10/26/11 2100 10/27/11 0559  BP: 128/75 132/75 145/90 148/83  Pulse: 92 94 79 82  Temp: 98.9 F (37.2 C) 98.8 F (37.1 C) 97.7 F (36.5 C) 97.8 F (36.6 C)  TempSrc: Oral Oral Oral Oral  Resp: 18 18 20 20   Height:      Weight:      SpO2: 94% 97% 95% 96%   Weight change:   Intake/Output Summary (Last 24 hours) at 10/27/11 0820 Last data filed at 10/27/11 0600  Gross per 24 hour  Intake   1103 ml  Output      0 ml  Net   1103 ml   Physical Exam: General appearance: alert, cooperative, appears stated age and no distress Head: Normocephalic, without obvious abnormality, atraumatic Eyes: patient still unable to close eye completely on left side Ears: normal TM's and external ear canals both ears Lungs: clear to auscultation bilaterally Heart: regular rate and rhythm, S1, S2 normal, no murmur, click, rub or gallop Abdomen: soft, non-tender; bowel sounds normal; no masses,  no organomegaly Neurologic: Alert and oriented X 3, normal strength and tone. Normal symmetric reflexes. Normal coordination and gait Cranial nerves: VII: upper facial muscle function abnormal on the left, VII: lower facial muscle function abnormal on the left  Micro Results: No results found for this or any previous visit (from the past 240 hour(s)). Studies/Results: Ct Head Wo Contrast  10/25/2011  *RADIOLOGY REPORT*  Clinical Data: Blurred vision. Left facial drooping.  Left facial paresthesias.  CT HEAD  WITHOUT CONTRAST  Technique:  Contiguous axial images were obtained from the base of the skull through the vertex without contrast.  Comparison: 02/22/2007  Findings: There is no acute intracranial hemorrhage, infarction, or mass.  Brain parenchyma is normal.  Osseous structures are normal. Orbits appear normal.  IMPRESSION: Normal exam.  Original Report Authenticated By: Gwynn Burly, M.D.   Medications: I have reviewed the patient's current medications. Scheduled Meds:   . aspirin EC  81 mg Oral Daily  . enoxaparin  40 mg Subcutaneous Q24H  . predniSONE  60 mg Oral Daily  . sodium chloride  3 mL Intravenous Q12H  . valACYclovir  1,000 mg Oral TID   Continuous Infusions:  PRN Meds:.sodium chloride, acetaminophen, acetaminophen, labetalol, ondansetron (ZOFRAN) IV, ondansetron, sodium chloride Assessment/Plan: Active Problems: 1. Blurry vision, left eye -likely related to tearing related to inability to close L eye completely. -will prescribe artificial tears to be used every hour while awake and ophthalmic ointment to be used at night until symptoms resolve.  2. 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  Small ischemic posterior circulation stroke unlikely, but remote possibility. Recs include:  -Lyme titre-pending  -TSH-pending -Continue Valtrex 1000mg  PO TID x 7 days total course, begun 10/25/11 -Continue Prednisone 60mg  PO QD x 7 days total, begun 10/25/11 -Begin Aspirin 81mg  daily for cardiac and stroke protection given possible HTN and long history  of smoking. -Cardiac Dopplers wnl, no large atherosclerotic disease noted. -Telemetry-no events of arrhythmia during 24hr monitoring, will discontinue. Monitor as outpatient.   Hypertension -Patient's blood pressure has dropped since admission without  treatment since initial labetalol dose on 1/7. Initially down to 120'-130's,  however, now up into the 140's/80's.  This may be his baseline or elevated in response to prednisone. -Monitor as outpatient and consider need for chronic HTN treatment in future.    Dispo: to home with follow up with PCP, neurology, and ophthamology in 1-2 weeks.   LOS: 2 days   Dwaine Gale 10/27/2011, 8:20 AM

## 2011-10-28 ENCOUNTER — Telehealth: Payer: Self-pay | Admitting: *Deleted

## 2011-10-28 LAB — B. BURGDORFI ANTIBODIES: B burgdorferi Ab IgG+IgM: 0.13 {ISR}

## 2011-10-28 MED ORDER — PREDNISONE 20 MG PO TABS: 60.0000 mg | ORAL_TABLET | Freq: Every day | ORAL | Status: AC

## 2011-10-28 NOTE — Telephone Encounter (Signed)
Review of the discharge summary states that he was to remain on Prednisone 60 mg by mouth every morning for 10 days.  Therefore, he needed #30 tablets of prednisone 20 mg to complete the course.  I have ordered the other 25 tablets for pharmacy pick-up as a phone in order.  Please call Brent Sharp and given him our apologies for the short fall in prednisone tablets.  Please phone-in the prednisone to the pharmacy of his choice.  Thanks.

## 2011-10-28 NOTE — Telephone Encounter (Signed)
Received a call from pt with question about the amount of prednisone he is to take. He was given a Rx for prednisone 20 mg - take 3 tabs daily.  He was only given # 5 pills.  That is less than 2 day supply. I paged Dr Maisie Fus for clarification but no return call.  Can you help with this? Pt # [id number]

## 2011-10-28 NOTE — Telephone Encounter (Signed)
Rx called in as written to [facility] aid on [address] Pt informed

## 2011-11-03 ENCOUNTER — Encounter: Payer: Self-pay | Admitting: Internal Medicine

## 2011-11-03 ENCOUNTER — Ambulatory Visit (INDEPENDENT_AMBULATORY_CARE_PROVIDER_SITE_OTHER): Payer: 59 | Admitting: Internal Medicine

## 2011-11-03 VITALS — BP 134/76 | HR 94 | Temp 97.2°F | Ht 71.0 in | Wt 146.6 lb

## 2011-11-03 DIAGNOSIS — G510 Bell's palsy: Secondary | ICD-10-CM

## 2011-11-03 DIAGNOSIS — I10 Essential (primary) hypertension: Secondary | ICD-10-CM

## 2011-11-03 DIAGNOSIS — Z0000 Encounter for general adult medical examination without abnormal findings: Secondary | ICD-10-CM

## 2011-11-03 MED ORDER — ARTIFICIAL TEAR OINTMENT OP OINT: 1.0000 "application " | TOPICAL_OINTMENT | Freq: Every day | OPHTHALMIC | Status: AC

## 2011-11-03 NOTE — Patient Instructions (Addendum)
Return to clinic in 6 months Please call us to be seen in clinic if you have chest pain or you feel your blood pressure is high  For the remainder of your prednisone course: Take two tablets (40mg  total) for three days, Then take one tablet (20mg  total) for three days, Then stop

## 2011-11-03 NOTE — Assessment & Plan Note (Addendum)
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  Return in 6 months Advised patient to limit alcohol consumption to no more than 2 drinks per day.  He agreed to try this.

## 2011-11-03 NOTE — Progress Notes (Signed)
Subjective:   Patient ID: Brent Sharp male   DOB: February 13, 1951 61 y.o.   MRN: [id number]  HPI: Mr.Brent Sharp is a 61 y.o. here for hospital follow-up for Bell's Palsy and high blood pressure.  He has not previously seen a doctor in 35 years.  He presented with L facial paralysis.  Neurology consultant felt it was Bell's Palsy, CT head negative, MRI brain not performed as patient is claustrophobic.  Today he believes that his facial paralysis has slightly improved.  He has a tiny bit of decreased sensation on L upper lip but otherwise no sensory deficit.    BP was 224/122 on admission. Responded to labetalol  X 1 dose and was normal for remainder of admission.  Not discharged on any BP medication.  No headaches, numbness or tingling anywhere.  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  He drinks 1 shot and 2 beers per day.    He says his Valtrex course is complete and he has 5 more days of prednisone left as there was some confusion on his prednisone course (he thinks he was told conflicting instructions).  He has taken 60mg  prednisone for 5 days so far.     Past Medical History  Diagnosis Date  . Bell's palsy 1980    L sided, resolved completely in about 1 week  . Shingles     at age 7 and 55, both outbreaks on back, treatment with "shots" per patient  . 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 Artificial Tear Ointment OINT Apply 1 application to eye at bedtime.  30 each  0  . aspirin EC 81 MG EC tablet Take 1 tablet (81 mg total) by mouth daily.  30 tablet  0  . predniSONE (DELTASONE) 20 MG tablet Take 3 tablets (60 mg total) by mouth daily.  25 tablet  0   Family History  Problem Relation Age of Onset  . Stroke Mother 7  . Aortic aneurysm Father 54    aortic valve aneurysm s/p transplant  . Diabetes  Maternal Grandmother    History   Social History  . Marital Status: Single    Spouse Name: N/A    Number of Children: 0  . Years of Education: N/A   Occupational History  . accountant    Social History Main Topics  . Smoking status: Current Everyday Smoker -- 0.5 packs/day for 47 years    Types: Cigarettes  . Smokeless tobacco: None  . Alcohol Use: Yes     1 shot and 2 beers every other day  . Drug Use: No  . Sexually Active: None   Other Topics Concern  . None   Social History Narrative   Lives alone, single in [place].Does not drive 2/2 several DUIs in past.No pets.Works as an Airline pilot.   Review of Systems: Constitutional: Denies fever, chills, diaphoresis, appetite change and fatigue.  Respiratory: Denies SOB, DOE, cough, chest tightness,  and wheezing.   Cardiovascular: Denies chest pain, palpitations and leg swelling.  Gastrointestinal: Denies nausea, vomiting, abdominal pain, diarrhea, constipation Genitourinary: Denies dysuria, urgency, frequency, hematuria, flank pain and difficulty urinating.  Skin: Denies pallor, rash and wound.  Neurological: Denies dizziness, seizures, syncope, weakness, light-headedness, numbness and headaches.    Objective:  Physical Exam: Filed Vitals:  11/03/11 1109 11/03/11 1112  BP: 146/91 134/76  Pulse: 109 94  Temp: 97.2 F (36.2 C)   TempSrc: Oral   Height: 5\' 11"  (1.803 m)   Weight: 146 lb 9.6 oz (66.497 kg)    Constitutional: Vital signs reviewed.  Patient is a well-developed and well-nourished male in no acute distress and cooperative with exam. Alert and oriented x3.  Head: Normocephalic and atraumatic Mouth: no erythema or exudates, MMM Eyes: PERRL, EOMI, conjunctivae normal, No scleral icterus.  Neck: No JVD or carotid bruit present.  Cardiovascular: RRR, S1 normal, S2 normal, no MRG, pulses symmetric and intact bilaterally Pulmonary/Chest: CTAB, no wheezes, rales, or rhonchi Neurological: A&O x3, Strength is  normal and symmetric bilaterally, no focal motor deficit, sensory intact to light touch bilaterally. Sensation to light touch symmetric and intact on face.   L facial droop with some movement of L cheek.  L forehead also has some movement.  L eye closes fully with great effort but with relaxed effort as in sleeping does not close fully.    Assessment & Plan:

## 2011-11-03 NOTE — Assessment & Plan Note (Signed)
BP ok today, 134/76 on my recheck.  Patient says he can feel when blood pressure is high "in his neck".  He says this happens about four times per year, including on day of admission to hospital.  Instructed him to call clinic immediately if he feels his pressure is high in the future.  Return to clinic 6 months for recheck

## 2011-11-03 NOTE — Assessment & Plan Note (Signed)
Per patient some movement is returning in his L cheek.  Partial L sheek movement on exam and partial L forehead movement.  Overall seems to be improving.   Good forced eye closure, but eye does not fully close when he gently closes as he would while sleeping.    -Prednisone x 5 more days, has #9 tablets left so take two tablets daily (40mg  total) for three days and then one tablet daily (20mg ) for three days then stop. -Nightly eye ointment to keep eye lubricated during night to prevent corneal irritation/ulcertion.  Artificial tears PRN.

## 2011-11-05 NOTE — Progress Notes (Signed)
I agree with Dr. Wainwright's assessment and plan. 

## 2013-06-12 ENCOUNTER — Encounter: Payer: Self-pay | Admitting: Internal Medicine

## 2013-06-12 IMAGING — CT CT HEAD W/O CM
1 series · 16 of 30 positions shown, 20 images · non-contrast
Comparison: 02/22/2007

CLINICAL DATA: Blurred vision. Left facial drooping.  Left facial
paresthesias.

CT HEAD WITHOUT CONTRAST
TECHNIQUE: Contiguous axial images were obtained from the base of
the skull through the vertex without contrast.

[Series 2: head routine 4.8 h37s · axial · 0.43mm/px · z∈[-132,-4]mm · 16 of 30 slices shown, 20 images]
[im 2/30  brain]
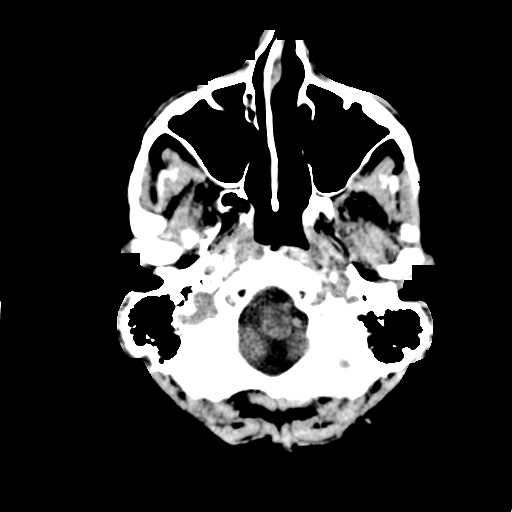
[im 2/30  bone]
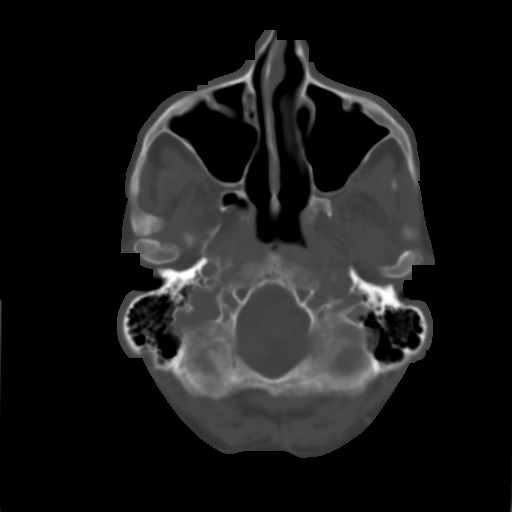
[im 4/30  brain]
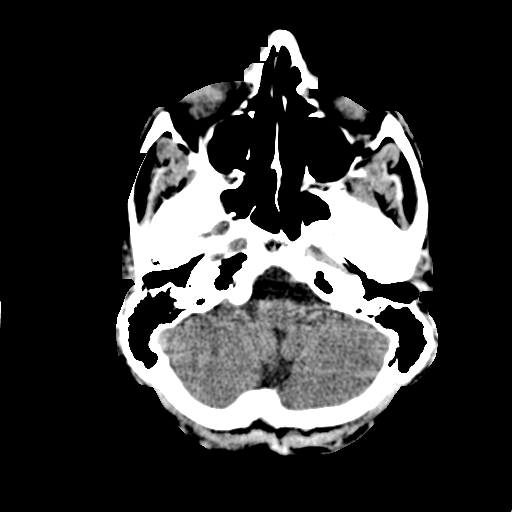
[im 6/30  brain]
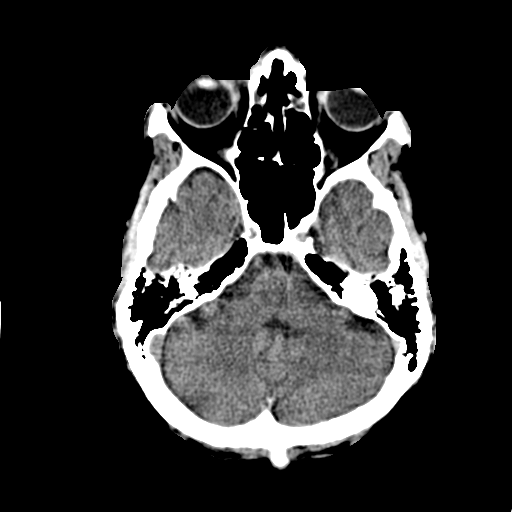
[im 8/30  brain]
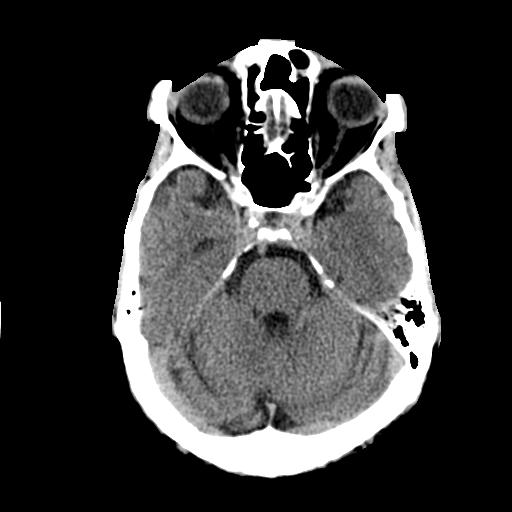
[im 9/30  brain]
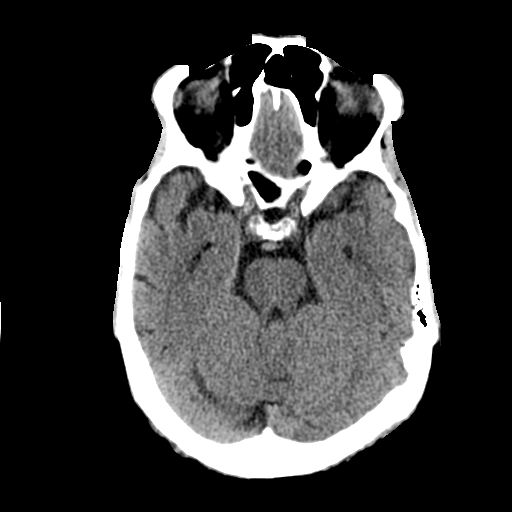
[im 9/30  bone]
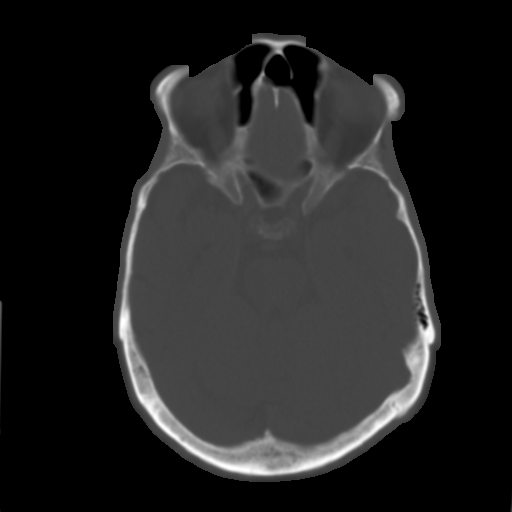
[im 11/30  brain]
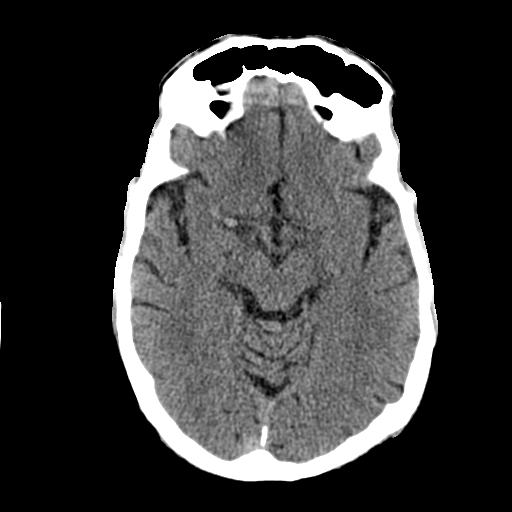
[im 13/30  brain]
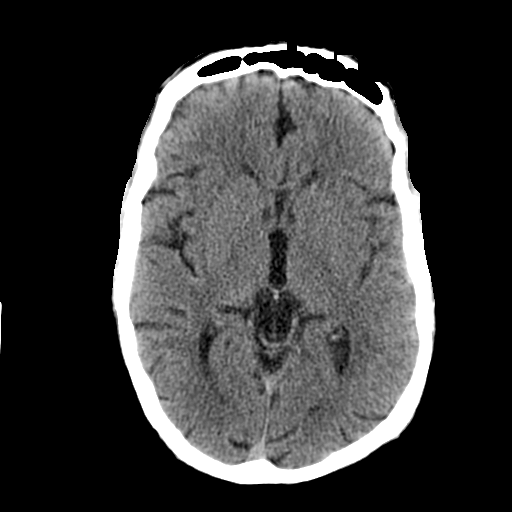
[im 15/30  brain]
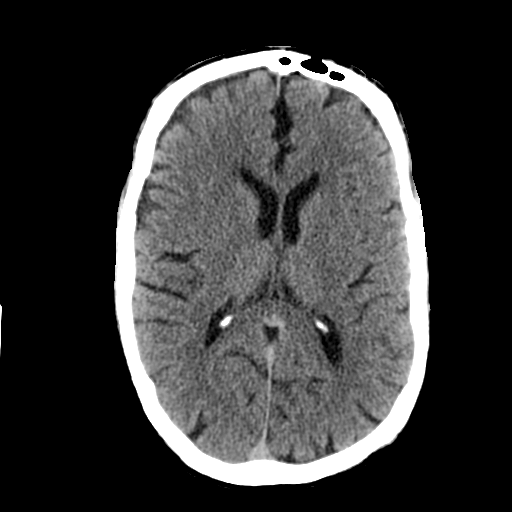
[im 16/30  brain]
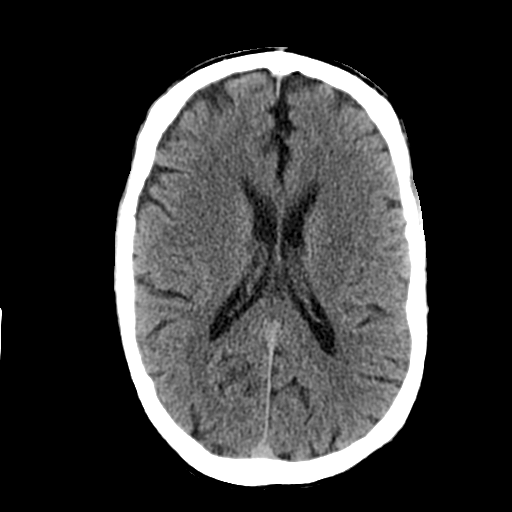
[im 16/30  bone]
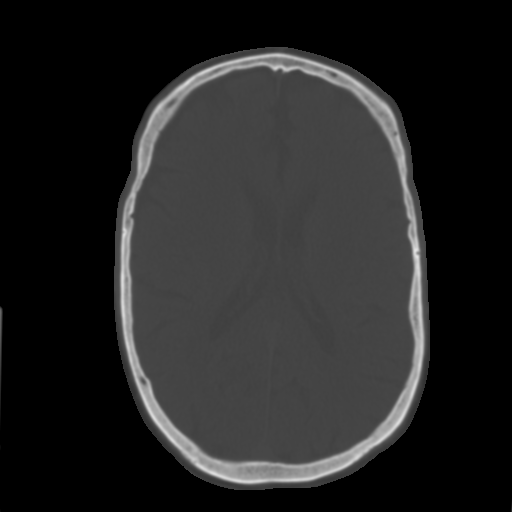
[im 18/30  brain]
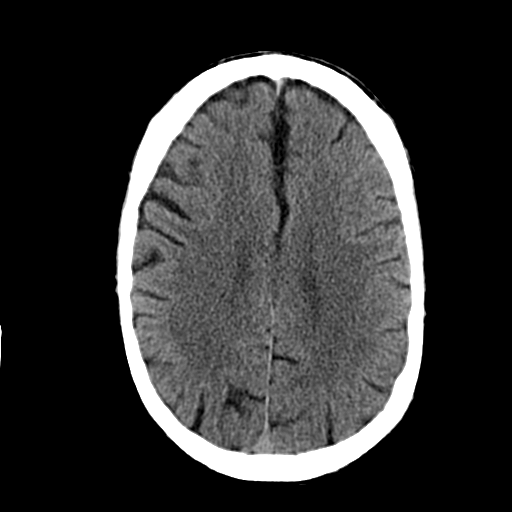
[im 20/30  brain]
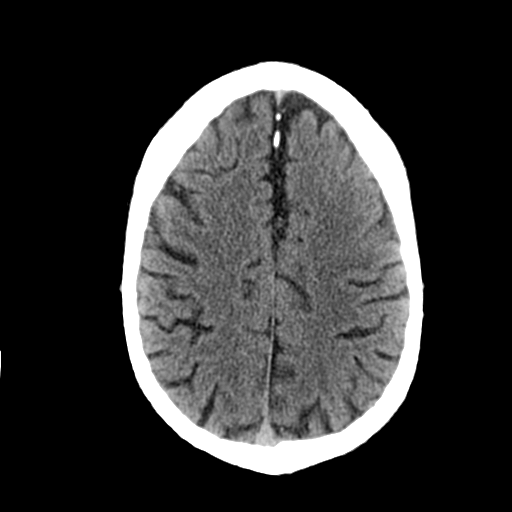
[im 22/30  brain]
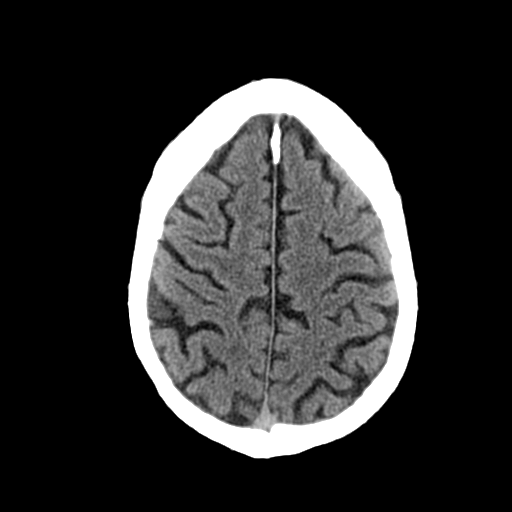
[im 23/30  brain]
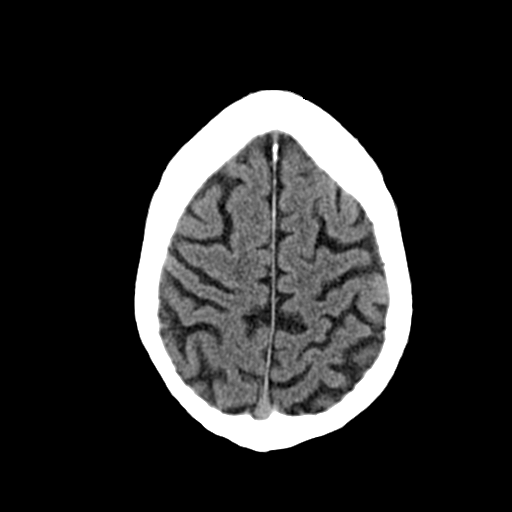
[im 23/30  bone]
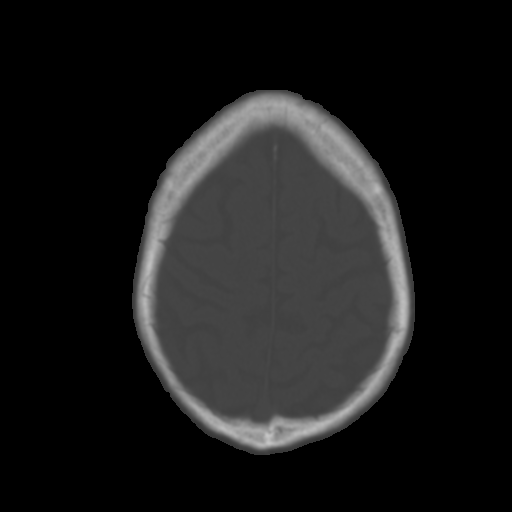
[im 25/30  brain]
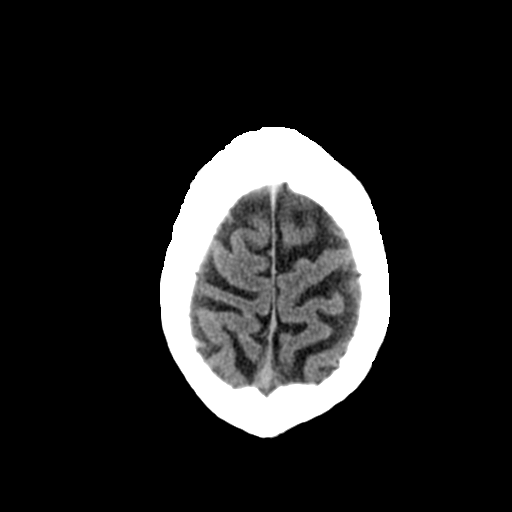
[im 27/30  brain]
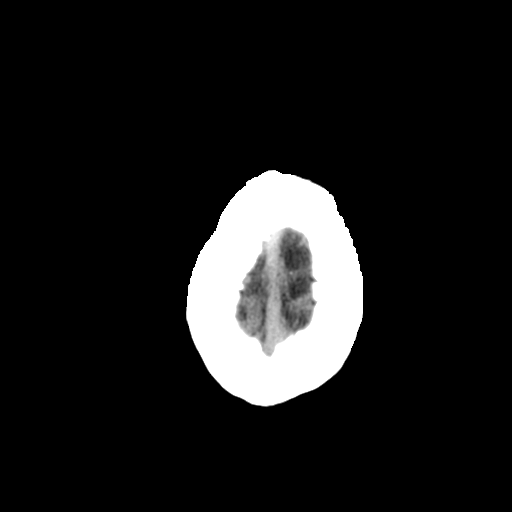
[im 29/30  brain]
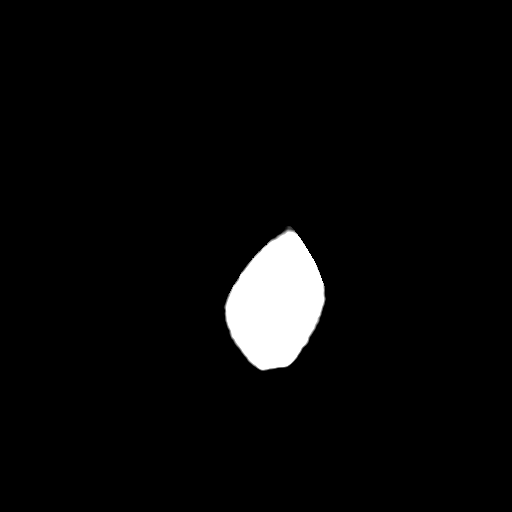

[16 of 30 positions shown; findings below may reference images not displayed]

FINDINGS: There is no acute intracranial hemorrhage, infarction, or
mass.  Brain parenchyma is normal.  Osseous structures are normal.
Orbits appear normal.
IMPRESSION: Normal exam.

## 2020-07-09 ENCOUNTER — Encounter: Payer: Self-pay | Admitting: Dermatology

## 2020-07-09 ENCOUNTER — Ambulatory Visit (INDEPENDENT_AMBULATORY_CARE_PROVIDER_SITE_OTHER): Payer: Medicare Other | Admitting: Dermatology

## 2020-07-09 ENCOUNTER — Other Ambulatory Visit: Payer: Self-pay

## 2020-07-09 DIAGNOSIS — D485 Neoplasm of uncertain behavior of skin: Secondary | ICD-10-CM | POA: Diagnosis not present

## 2020-07-09 DIAGNOSIS — C44719 Basal cell carcinoma of skin of left lower limb, including hip: Secondary | ICD-10-CM

## 2020-07-09 DIAGNOSIS — Z1283 Encounter for screening for malignant neoplasm of skin: Secondary | ICD-10-CM

## 2020-07-09 NOTE — Patient Instructions (Signed)

## 2020-07-15 ENCOUNTER — Telehealth: Payer: Self-pay | Admitting: Dermatology

## 2020-07-15 NOTE — Telephone Encounter (Signed)
Patient left message on office voice mail saying that he was calling for pathology results from last visit with Stuart Tafeen, MD. 

## 2020-07-16 NOTE — Telephone Encounter (Signed)
Pathology to patient 11/11 surgery made

## 2020-08-07 NOTE — Progress Notes (Signed)
   New Patient   Subjective  Witt Plitt is a 69 y.o. male who presents for the following: Annual Exam (left lower leg nonhealing lesion x months).  Annual skin exam Location:  Duration:  Quality:  Associated Signs/Symptoms: Modifying Factors:  Severity:  Timing: Context:    The following portions of the chart were reviewed this encounter and updated as appropriate:     Objective  Well appearing patient in no apparent distress; mood and affect are within normal limits.  A full examination was performed including scalp, head, eyes, ears, nose, lips, neck, chest, axillae, abdomen, back, buttocks, bilateral upper extremities, bilateral lower extremities, hands, feet, fingers, toes, fingernails, and toenails. All findings within normal limits unless otherwise noted below.   Assessment & Plan  Neoplasm of uncertain behavior of skin (2) Left Side Lower Leg - Anterior  Skin / nail biopsy Type of biopsy: tangential   Informed consent: discussed and consent obtained   Timeout: patient name, date of birth, surgical site, and procedure verified   Procedure prep:  Patient was prepped and draped in usual sterile fashion (Non sterile) Prep type:  Chlorhexidine Anesthesia: the lesion was anesthetized in a standard fashion   Anesthetic:  1% lidocaine w/ epinephrine 1-100,000 local infiltration Instrument used: flexible razor blade   Outcome: patient tolerated procedure well   Post-procedure details: wound care instructions given    Specimen 1 - Surgical pathology Differential Diagnosis: BCC SCC Atypical mole Check Margins: No  Mid Back  Skin / nail biopsy Type of biopsy: tangential   Informed consent: discussed and consent obtained   Timeout: patient name, date of birth, surgical site, and procedure verified   Procedure prep:  Patient was prepped and draped in usual sterile fashion (Non sterile) Prep type:  Chlorhexidine Anesthesia: the lesion was anesthetized in a standard  fashion   Anesthetic:  1% lidocaine w/ epinephrine 1-100,000 local infiltration Instrument used: flexible razor blade   Outcome: patient tolerated procedure well   Post-procedure details: wound care instructions given    Specimen 2 - Surgical pathology Differential Diagnosis: BCC SCC Check Margins: No  Encounter for screening for malignant neoplasm of skin Neck - Anterior  Yearly skin exam Skin cancer screening performed today.

## 2020-08-11 ENCOUNTER — Encounter: Payer: Self-pay | Admitting: Dermatology

## 2020-08-28 ENCOUNTER — Other Ambulatory Visit: Payer: Self-pay

## 2020-08-28 ENCOUNTER — Encounter: Payer: Self-pay | Admitting: Dermatology

## 2020-08-28 ENCOUNTER — Ambulatory Visit (INDEPENDENT_AMBULATORY_CARE_PROVIDER_SITE_OTHER): Payer: Medicare Other | Admitting: Dermatology

## 2020-08-28 DIAGNOSIS — D0472 Carcinoma in situ of skin of left lower limb, including hip: Secondary | ICD-10-CM

## 2020-08-28 DIAGNOSIS — D099 Carcinoma in situ, unspecified: Secondary | ICD-10-CM

## 2020-08-28 MED ORDER — MUPIROCIN 2 % EX OINT: 1.0000 "application " | TOPICAL_OINTMENT | Freq: Two times a day (BID) | CUTANEOUS | 0 refills | Status: DC

## 2020-08-28 MED ORDER — MUPIROCIN 2 % EX OINT: 1.0000 "application " | TOPICAL_OINTMENT | Freq: Two times a day (BID) | CUTANEOUS | 0 refills | Status: AC

## 2020-08-28 NOTE — Patient Instructions (Signed)

## 2020-09-10 ENCOUNTER — Encounter: Payer: Self-pay | Admitting: Dermatology

## 2020-09-10 NOTE — Progress Notes (Signed)
   Follow-Up Visit   Subjective  Brent Sharp is a 69 y.o. male who presents for the following: Procedure (left side lower leg).  CIS Location: Left leg Duration:  Quality:  Associated Signs/Symptoms: Modifying Factors:  Severity:  Timing: Context: For treatment  Objective  Well appearing patient in no apparent distress; mood and affect are within normal limits.  A focused examination was performed including arms and legs.   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  The documentation on 09/10/20 for the exam, diagnosis, procedures, and orders are all accurate and complete.

## 2020-12-02 ENCOUNTER — Other Ambulatory Visit: Payer: Self-pay

## 2020-12-02 ENCOUNTER — Ambulatory Visit (INDEPENDENT_AMBULATORY_CARE_PROVIDER_SITE_OTHER): Payer: Medicare Other | Admitting: Dermatology

## 2020-12-02 ENCOUNTER — Encounter: Payer: Self-pay | Admitting: Dermatology

## 2020-12-02 DIAGNOSIS — Z1283 Encounter for screening for malignant neoplasm of skin: Secondary | ICD-10-CM

## 2020-12-02 DIAGNOSIS — L710 Perioral dermatitis: Secondary | ICD-10-CM

## 2020-12-02 DIAGNOSIS — Z86007 Personal history of in-situ neoplasm of skin: Secondary | ICD-10-CM

## 2020-12-10 ENCOUNTER — Encounter: Payer: Self-pay | Admitting: Dermatology

## 2020-12-10 NOTE — Progress Notes (Signed)
   Follow-Up Visit   Subjective  Brent Sharp is a 70 y.o. male who presents for the following: Follow-up (Follow up on left lower leg-a little darker after treatment -no new concerns.).  Rash on chin, history of skin cancer removal left leg Location:  Duration:  Quality:  Associated Signs/Symptoms: Modifying Factors:  Severity:  Timing: Context:   Objective  Well appearing patient in no apparent distress; mood and affect are within normal limits. Objective  Mid Chin: Subtly papular patchy dermatitis chin: Likely mostly from Covid mask but cannot rule out mild perioral dermatitis.  Objective  Left Lower Leg - Anterior: Clear today with some PIH (lesion was actually a basal cell carcinoma).  Objective  Neck, head and legs: Head, neck, and leg checked today no signs of atypical moles, melanoma or non mole skin cancer.    A focused examination was performed including Waist up examination plus legs. Relevant physical exam findings are noted in the Assessment and Plan.   Assessment & Plan    Perioral dermatitis Mid Chin  Not currently bothersome to patient so no intervention.  He can call me if this worsens.  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  The documentation on 12/10/20 for the exam, diagnosis, procedures, and orders are all accurate and complete.
